# Patient Record
Sex: Female | Born: 1960 | Race: Black or African American | Hispanic: No | Marital: Married | Smoking: Never smoker
Health system: Southern US, Community
[De-identification: ages and names within clinical notes are randomized; demographics above are authoritative.]

## PROBLEM LIST (undated history)

## (undated) DIAGNOSIS — M5430 Sciatica, unspecified side: Secondary | ICD-10-CM

## (undated) DIAGNOSIS — F329 Major depressive disorder, single episode, unspecified: Secondary | ICD-10-CM

## (undated) DIAGNOSIS — I10 Essential (primary) hypertension: Secondary | ICD-10-CM

## (undated) DIAGNOSIS — F32A Depression, unspecified: Secondary | ICD-10-CM

## (undated) DIAGNOSIS — K219 Gastro-esophageal reflux disease without esophagitis: Secondary | ICD-10-CM

## (undated) HISTORY — DX: Major depressive disorder, single episode, unspecified: F32.9

## (undated) HISTORY — DX: Gastro-esophageal reflux disease without esophagitis: K21.9

## (undated) HISTORY — DX: Sciatica, unspecified side: M54.30

## (undated) HISTORY — DX: Depression, unspecified: F32.A

## (undated) HISTORY — DX: Essential (primary) hypertension: I10

---

## 2008-10-17 ENCOUNTER — Encounter: Admission: RE | Admit: 2008-10-17 | Discharge: 2008-10-17 | Payer: Self-pay | Admitting: General Practice

## 2010-03-26 ENCOUNTER — Emergency Department (HOSPITAL_COMMUNITY)
Admission: EM | Admit: 2010-03-26 | Discharge: 2010-03-26 | Payer: Self-pay | Source: Home / Self Care | Admitting: Emergency Medicine

## 2010-03-26 ENCOUNTER — Emergency Department (HOSPITAL_COMMUNITY)
Admission: EM | Admit: 2010-03-26 | Discharge: 2010-03-26 | Disposition: A | Discharge status: Other Acute Inpatient Hospital | Payer: Self-pay | Source: Home / Self Care | Admitting: Emergency Medicine

## 2010-07-29 LAB — POCT I-STAT, CHEM 8
BUN: 3 mg/dL — ABNORMAL LOW (ref 6–23)
Chloride: 108 mEq/L (ref 96–112)
Creatinine, Ser: 0.7 mg/dL (ref 0.4–1.2)
Potassium: 3.8 mEq/L (ref 3.5–5.1)
Sodium: 141 mEq/L (ref 135–145)

## 2010-07-29 LAB — POCT PREGNANCY, URINE: Preg Test, Ur: NEGATIVE

## 2013-12-15 ENCOUNTER — Other Ambulatory Visit: Payer: Self-pay | Admitting: Infectious Disease

## 2013-12-15 ENCOUNTER — Ambulatory Visit
Admission: RE | Admit: 2013-12-15 | Discharge: 2013-12-15 | Disposition: A | Discharge status: Home / Self Care | Payer: No Typology Code available for payment source | Source: Ambulatory Visit | Attending: Infectious Disease | Admitting: Infectious Disease

## 2013-12-15 DIAGNOSIS — R7611 Nonspecific reaction to tuberculin skin test without active tuberculosis: Secondary | ICD-10-CM

## 2014-10-11 ENCOUNTER — Ambulatory Visit (INDEPENDENT_AMBULATORY_CARE_PROVIDER_SITE_OTHER): Payer: Managed Care, Other (non HMO) | Admitting: Medical

## 2014-10-11 ENCOUNTER — Ambulatory Visit (HOSPITAL_BASED_OUTPATIENT_CLINIC_OR_DEPARTMENT_OTHER)
Admission: RE | Admit: 2014-10-11 | Discharge: 2014-10-11 | Disposition: A | Discharge status: Home / Self Care | Payer: Managed Care, Other (non HMO) | Source: Ambulatory Visit | Attending: Medical | Admitting: Medical

## 2014-10-11 ENCOUNTER — Other Ambulatory Visit: Payer: Self-pay | Admitting: Medical

## 2014-10-11 ENCOUNTER — Encounter: Payer: Self-pay | Admitting: Medical

## 2014-10-11 VITALS — BP 148/98 | HR 78 | Temp 98.3°F | Resp 18 | Ht 63.0 in | Wt 186.0 lb

## 2014-10-11 DIAGNOSIS — M2550 Pain in unspecified joint: Secondary | ICD-10-CM

## 2014-10-11 DIAGNOSIS — M79641 Pain in right hand: Secondary | ICD-10-CM

## 2014-10-11 DIAGNOSIS — I10 Essential (primary) hypertension: Secondary | ICD-10-CM

## 2014-10-11 DIAGNOSIS — G4700 Insomnia, unspecified: Secondary | ICD-10-CM | POA: Diagnosis not present

## 2014-10-11 DIAGNOSIS — K219 Gastro-esophageal reflux disease without esophagitis: Secondary | ICD-10-CM

## 2014-10-11 LAB — COMPREHENSIVE METABOLIC PANEL
ALBUMIN: 4.1 g/dL (ref 3.5–5.2)
ALK PHOS: 82 U/L (ref 39–117)
ALT: 22 U/L (ref 0–35)
AST: 23 U/L (ref 0–37)
BUN: 10 mg/dL (ref 6–23)
CALCIUM: 9.7 mg/dL (ref 8.4–10.5)
CHLORIDE: 104 meq/L (ref 96–112)
CO2: 27 meq/L (ref 19–32)
Creatinine, Ser: 0.65 mg/dL (ref 0.40–1.20)
GFR: 123.22 mL/min (ref >60.00)
Glucose, Bld: 114 mg/dL — ABNORMAL HIGH (ref 70–99)
Potassium: 3.4 mEq/L — ABNORMAL LOW (ref 3.5–5.1)
Sodium: 137 mEq/L (ref 135–145)
TOTAL PROTEIN: 7.9 g/dL (ref 6.0–8.3)
Total Bilirubin: 0.5 mg/dL (ref 0.2–1.2)

## 2014-10-11 LAB — CBC WITH DIFFERENTIAL/PLATELET
Basophils Absolute: 0 10*3/uL (ref 0.0–0.1)
Basophils Relative: 0.6 % (ref 0.0–3.0)
EOS ABS: 0.2 10*3/uL (ref 0.0–0.7)
Eosinophils Relative: 4.4 % (ref 0.0–5.0)
HCT: 45 % (ref 36.0–46.0)
HEMOGLOBIN: 14.5 g/dL (ref 12.0–15.0)
LYMPHS PCT: 47.4 % — AB (ref 12.0–46.0)
Lymphs Abs: 2.1 10*3/uL (ref 0.7–4.0)
MCHC: 32.3 g/dL (ref 30.0–36.0)
MCV: 82.1 fl (ref 78.0–100.0)
MONOS PCT: 6.5 % (ref 3.0–12.0)
Monocytes Absolute: 0.3 10*3/uL (ref 0.1–1.0)
NEUTROS ABS: 1.9 10*3/uL (ref 1.4–7.7)
Neutrophils Relative %: 41.1 % — ABNORMAL LOW (ref 43.0–77.0)
PLATELETS: 272 10*3/uL (ref 150.0–400.0)
RBC: 5.48 Mil/uL — AB (ref 3.87–5.11)
RDW: 17 % — AB (ref 11.5–15.5)
WBC: 4.5 10*3/uL (ref 4.0–10.5)

## 2014-10-11 LAB — RHEUMATOID FACTOR: RHEUMATOID FACTOR: 15 [IU]/mL — AB (ref <=14)

## 2014-10-11 LAB — SEDIMENTATION RATE: SED RATE: 15 mm/h (ref 0–22)

## 2014-10-11 LAB — C-REACTIVE PROTEIN: CRP: 0.4 mg/dL — AB (ref 0.5–20.0)

## 2014-10-11 MED ORDER — AMLODIPINE BESYLATE 5 MG PO TABS: 5.0000 mg | ORAL_TABLET | Freq: Every day | ORAL | Status: DC

## 2014-10-11 MED ORDER — LISINOPRIL 20 MG PO TABS: 20.0000 mg | ORAL_TABLET | Freq: Every day | ORAL | Status: AC

## 2014-10-11 MED ORDER — TRAZODONE HCL 50 MG PO TABS: 50.0000 mg | ORAL_TABLET | Freq: Every day | ORAL | Status: AC

## 2014-10-11 MED ORDER — RANITIDINE HCL 150 MG PO CAPS: 150.0000 mg | ORAL_CAPSULE | Freq: Two times a day (BID) | ORAL | Status: AC

## 2014-10-11 NOTE — Progress Notes (Signed)
Subjective:    Patient ID: Natasha Williamson, female    DOB: 01-24-1961, 54 y.o.   MRN: [id number]  HPI   I have reviewed pt PMH, PSH, FH, Social History and Surgical History  Depression- Pt used to be on sertraline. In past that this disturbed sleep. She not sure if she is depressed.  Gerd- hx of and used to be on meds. She has not been on medication for this for one year. Pt used to be on zantac.  Htn- pt used to be on lisinopril and amlodipine. Lisinopril was 20 mg a day. Amlodipine was 5 mg q day.  Sciatica- hx of occasionally in the past.  Pt reports diffuse body aches all over. Describes over joints. Shoulder, hips, upper back neck. Hands hurts. Joints in hands hurt. Occasional diffuse chest soreness with body aches but not now. Does not describe cardiac type symptoms.  Pt [employer], rare exercise, No caffeine, marrried- 3 children.     Review of Systems  Constitutional: Negative for fever, chills, diaphoresis, activity change and fatigue.  Respiratory: Negative for cough, chest tightness and shortness of breath.   Cardiovascular: Negative for chest pain, palpitations and leg swelling.  Gastrointestinal: Negative for nausea, vomiting and abdominal pain.  Musculoskeletal: Positive for arthralgias. Negative for neck pain and neck stiffness.  Neurological: Positive for headaches. Negative for dizziness, tremors, seizures, syncope, facial asymmetry, speech difficulty, weakness, light-headedness and numbness.       Very faint minimal daily ha. No associated other neuro type signs or symptoms.  Psychiatric/Behavioral: Positive for sleep disturbance. Negative for behavioral problems, confusion, dysphoric mood and agitation. The patient is not nervous/anxious.      Past Medical History  Diagnosis Date  . Hypertension   . GERD (gastroesophageal reflux disease)   . Depression   . Sciatica     History   Social History  . Marital Status: Married    Spouse Name: N/A  . Number of  Children: N/A  . Years of Education: N/A   Occupational History  . Not on file.   Social History Main Topics  . Smoking status: Never Smoker   . Smokeless tobacco: Not on file  . Alcohol Use: Not on file  . Drug Use: Not on file  . Sexual Activity: Not on file   Other Topics Concern  . Not on file   Social History Narrative  . No narrative on file    No past surgical history on file.  Family History  Problem Relation Age of Onset  . Parkinson's disease Mother   . Diabetes Father     No Known Allergies  No current outpatient prescriptions on file prior to visit.   No current facility-administered medications on file prior to visit.    BP 148/98 mmHg  Pulse 78  Temp(Src) 98.3 F (36.8 C) (Oral)  Resp 18  Ht 5\' 3"  (1.6 m)  Wt 186 lb (84.369 kg)  BMI 32.96 kg/m2  SpO2 99%  LMP 07/13/2014 (Approximate)        Objective:   Physical Exam  General Mental Status- Alert. General Appearance- Not in acute distress.   Skin General: Color- Normal Color. Moisture- Normal Moisture.  Neck Carotid Arteries- Normal color. Moisture- Normal Moisture. No carotid bruits. No JVD.  Chest and Lung Exam Auscultation: Breath Sounds:-Normal. CTA.  Cardiovascular Auscultation:Rythm- Regular, rate and rhythm. Murmurs & Other Heart Sounds:Auscultation of the heart reveals- No Murmurs.  Abdomen Inspection:-Inspeection Normal. Palpation/Percussion:Note:No mass. Palpation and Percussion of the abdomen  reveal- Non Tender, Non Distended + BS, no rebound or guarding.    Neurologic Cranial Nerve exam:- CN III-XII intact(No nystagmus), symmetric smile. Drift Test:- No drift. Romberg Exam:- Negative.  Finger to Nose:- Normal/Intact Strength:- 5/5 equal and symmetric strength both upper and lower extremities.  Musculoskeletal- Shoulder- mild pain on rom Elbows- mild pain on rom. Hands- mild thickend mcp joints bilaterally. Mild pain on flexion and extension.         Assessment & Plan:

## 2014-10-11 NOTE — Assessment & Plan Note (Signed)
Most nights. Pt not sure if she is depressed. Rx trazadone. This should help with sleep and may help her mood.

## 2014-10-11 NOTE — Assessment & Plan Note (Signed)
Various joints. Will get arthritis panel. Get xray of rt hand which is area hurts most. Tylenol for pain. May advise nsaid when bp better controlled.

## 2014-10-11 NOTE — Assessment & Plan Note (Addendum)
Cbc, cmp and rx lisinopril and amlodipine. Her prior meds.   With bp elevation if any neuro or cardio signs or symptoms ED evaluation.

## 2014-10-11 NOTE — Assessment & Plan Note (Signed)
Refill zantac. Gerd diet.

## 2014-10-11 NOTE — Progress Notes (Signed)
Pre visit review using our clinic review tool, if applicable. No additional management support is needed unless otherwise documented below in the visit note. 

## 2014-10-11 NOTE — Patient Instructions (Addendum)
HTN (hypertension) Cbc, cmp and rx lisinopril and amlodipine. Her prior meds.   With bp elevation if any neuro or cardio signs or symptoms ED evaluation.   Insomnia Most nights. Pt not sure if she is depressed. Rx trazadone. This should help with sleep and may help her mood.   Arthralgia Various joints. Will get arthritis panel. Get xray of rt hand which is area hurts most. Tylenol for pain. May advise nsaid when bp better controlled.   GERD (gastroesophageal reflux disease) Refill zantac. Gerd diet.     Note mild faint ha daily may be bp . Related will follow this.   Follow up in 1 month or as needed.

## 2014-10-12 ENCOUNTER — Telehealth: Payer: Self-pay | Admitting: Medical

## 2014-10-12 ENCOUNTER — Encounter: Payer: Self-pay | Admitting: Medical

## 2014-10-12 DIAGNOSIS — M2550 Pain in unspecified joint: Secondary | ICD-10-CM

## 2014-10-12 LAB — ANA: Anti Nuclear Antibody(ANA): NEGATIVE

## 2014-10-12 NOTE — Telephone Encounter (Signed)
Refer to rheumatology  

## 2015-10-28 ENCOUNTER — Other Ambulatory Visit: Payer: Self-pay | Admitting: Family Medicine

## 2015-10-28 ENCOUNTER — Ambulatory Visit
Admission: RE | Admit: 2015-10-28 | Discharge: 2015-10-28 | Disposition: A | Discharge status: Home / Self Care | Payer: Managed Care, Other (non HMO) | Source: Ambulatory Visit | Attending: Family Medicine | Admitting: Family Medicine

## 2015-10-28 DIAGNOSIS — R0789 Other chest pain: Secondary | ICD-10-CM

## 2017-09-07 IMAGING — CR DG CHEST 2V
2 series · 2 of 2 positions shown · non-contrast
Comparison: None.

CLINICAL DATA: Anterior bilateral rib pain for 1 year without
history of trauma.

EXAM:
CHEST  2 VIEW

[w chest pa]
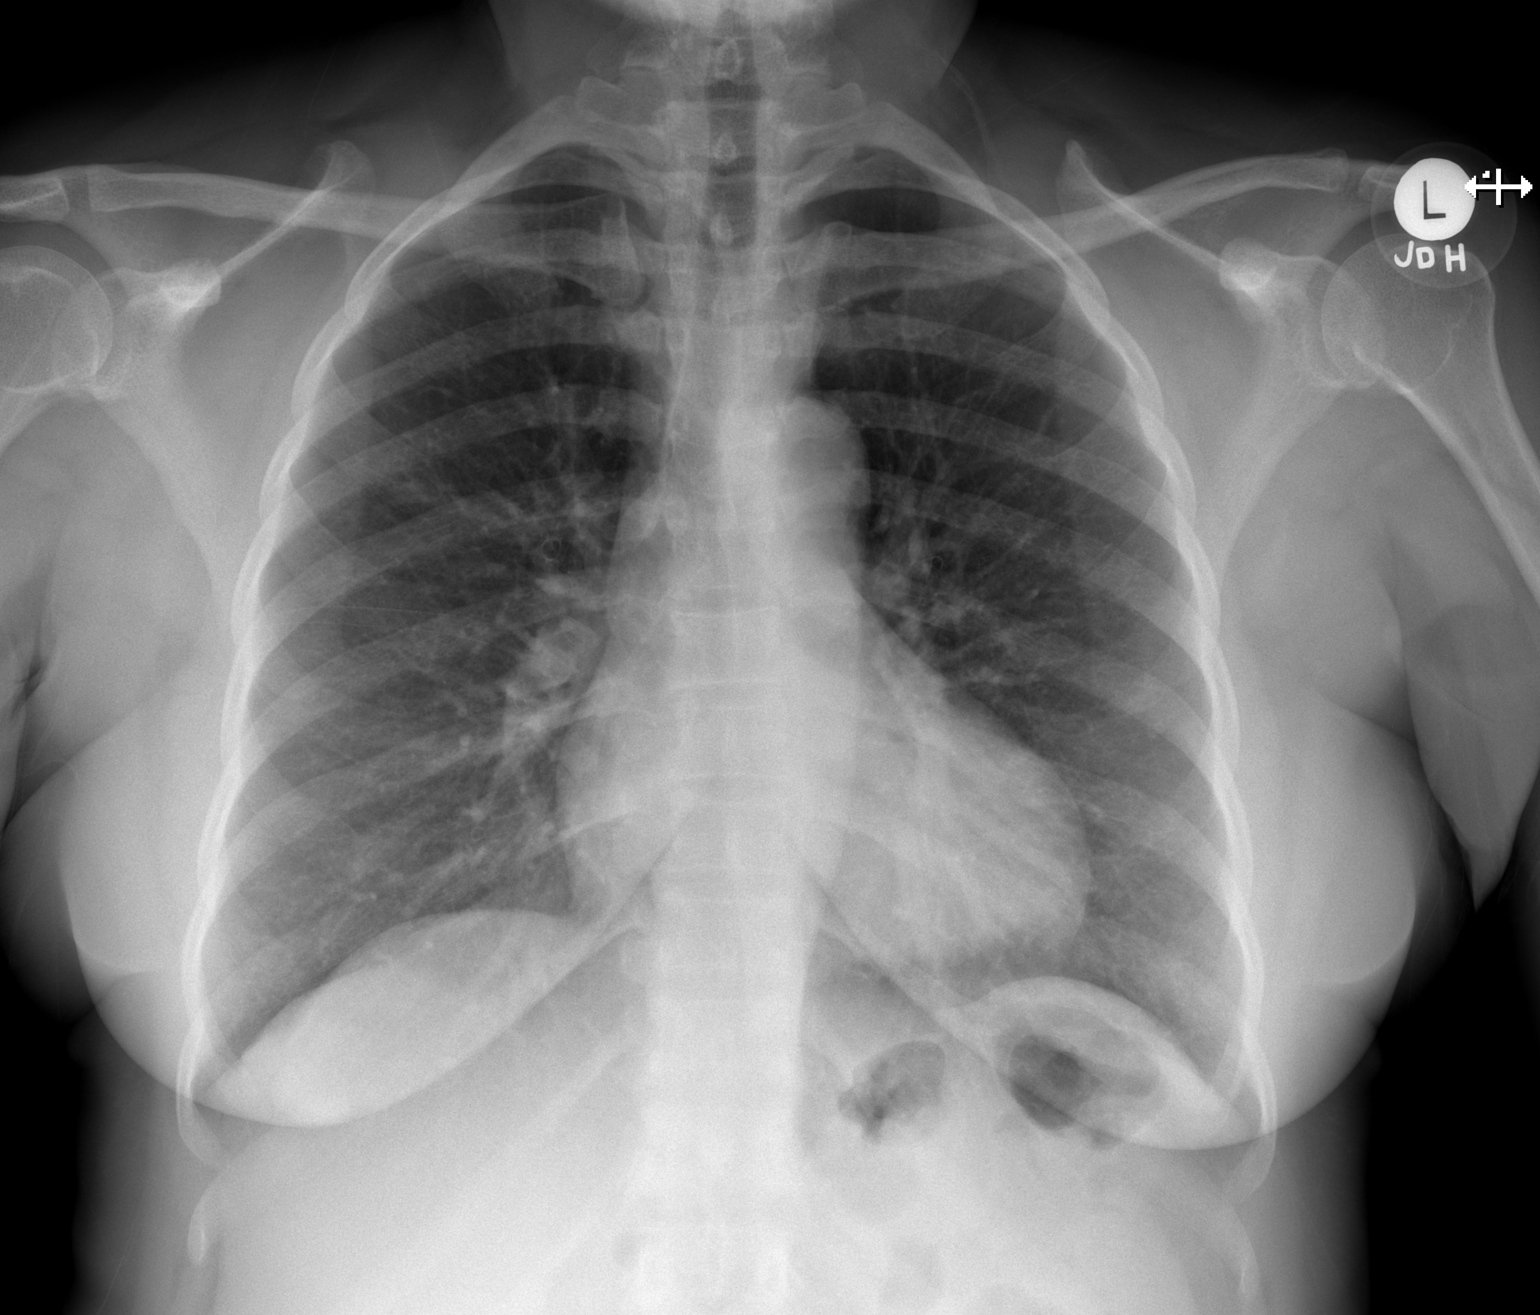

[w chest lat]
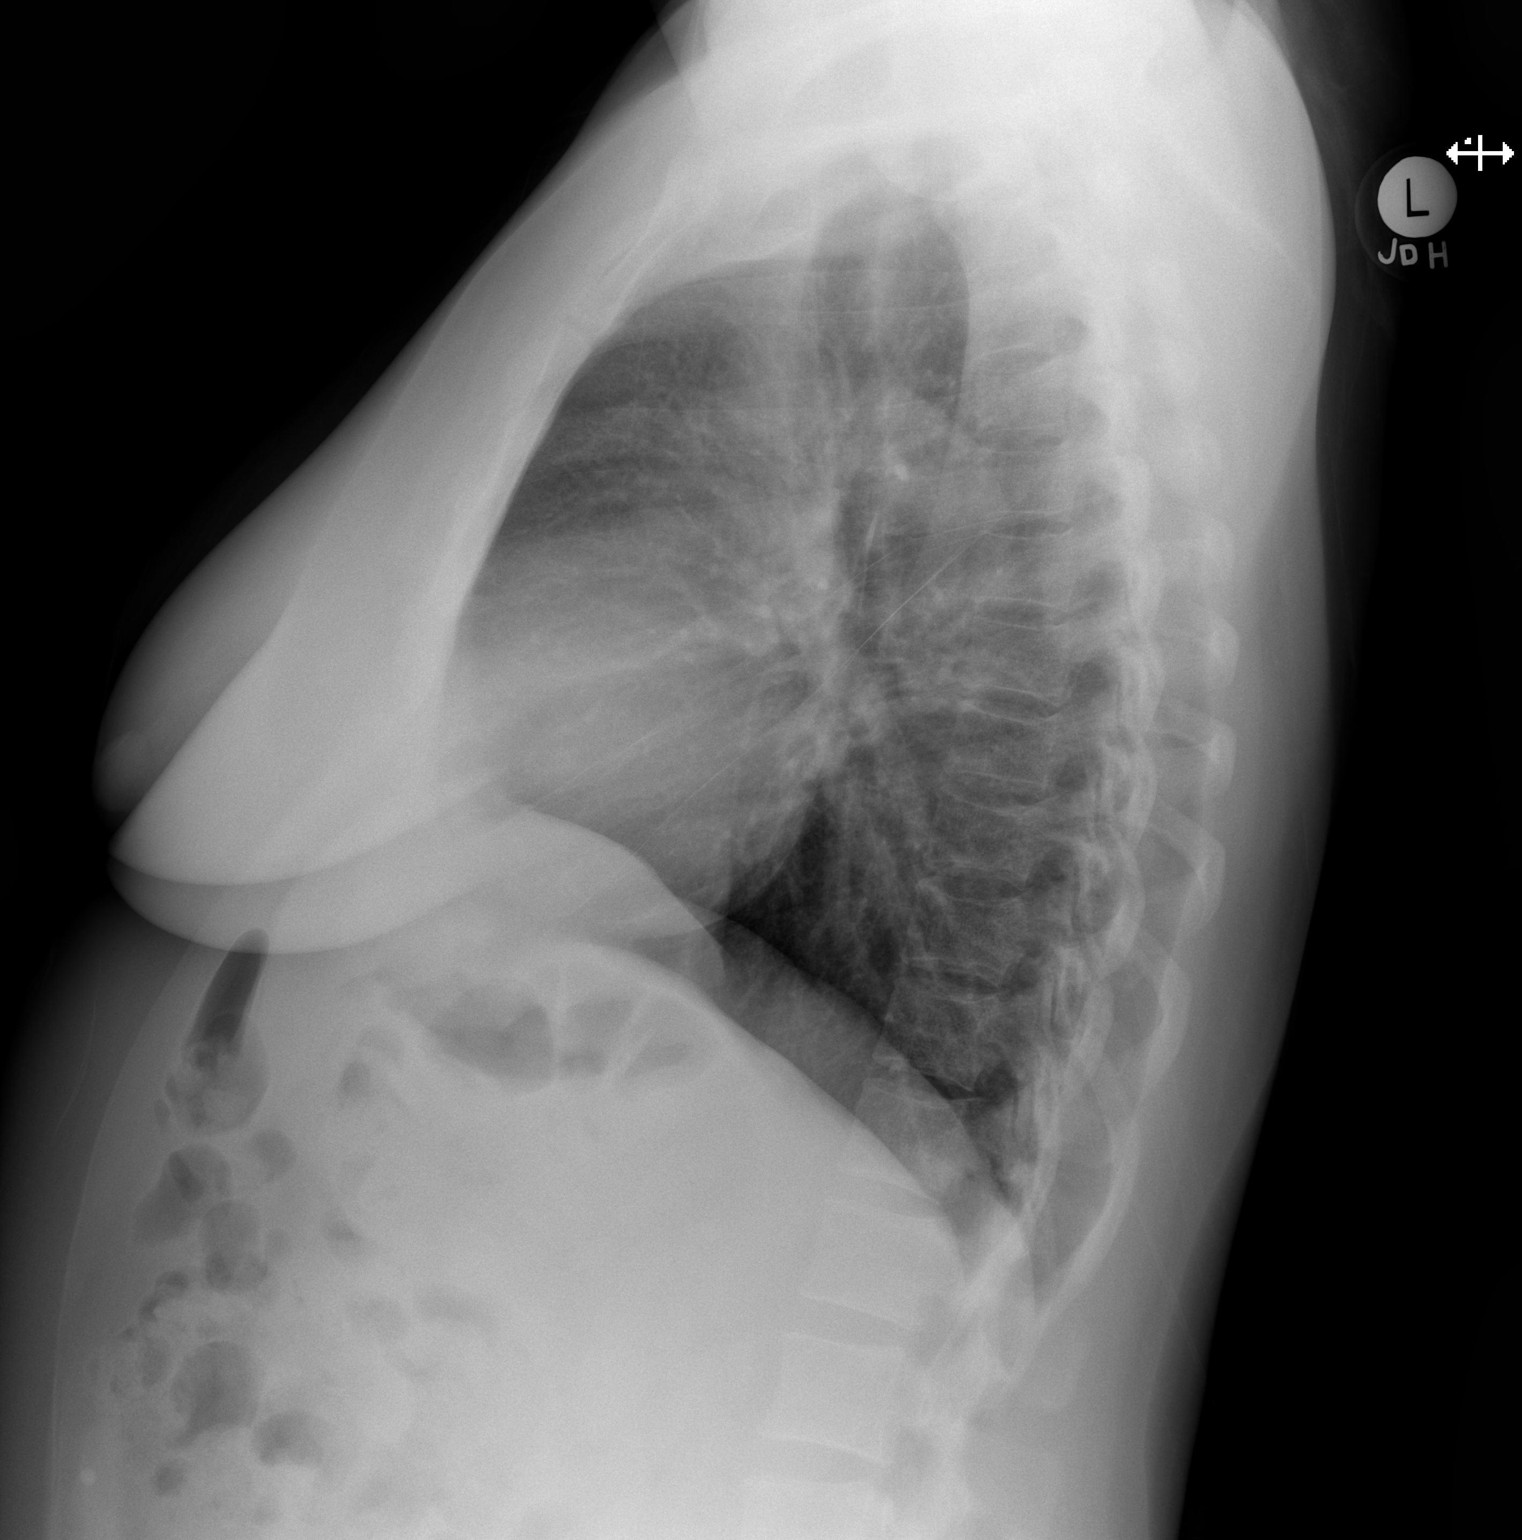

[2 of 2 positions shown; findings below may reference images not displayed]

FINDINGS: The lungs are clear wiithout focal pneumonia, edema, pneumothorax or
pleural effusion. The cardiopericardial silhouette is within normal
limits for size. The visualized bony structures of the thorax are
intact.
IMPRESSION: Stable.  No acute findings.

## 2021-02-07 ENCOUNTER — Encounter: Payer: Self-pay | Admitting: Pulmonary Disease

## 2021-02-07 ENCOUNTER — Telehealth: Payer: Self-pay

## 2021-02-07 ENCOUNTER — Ambulatory Visit (INDEPENDENT_AMBULATORY_CARE_PROVIDER_SITE_OTHER): Payer: BC Managed Care – PPO | Admitting: Pulmonary Disease

## 2021-02-07 ENCOUNTER — Other Ambulatory Visit: Payer: Self-pay

## 2021-02-07 VITALS — BP 140/86 | HR 59 | Temp 98.6°F | Ht 62.0 in | Wt 195.0 lb

## 2021-02-07 DIAGNOSIS — R0683 Snoring: Secondary | ICD-10-CM | POA: Diagnosis not present

## 2021-02-07 NOTE — Patient Instructions (Signed)
Schedule you for an in lab polysomnogram  Schedule you for breathing study  We will get a copy of your chest x-ray from your primary doctor's office to review  Tentative follow-up in about 3 months  Try as best as he can to get more hours of sleep, about 6 hours of sleep is recommended for every adult   Sleep Apnea Sleep apnea affects breathing during sleep. It causes breathing to stop for 10 seconds or more, or to become shallow. People with sleep apnea usually snore loudly. It can also increase the risk of: Heart attack. Stroke. Being very overweight (obese). Diabetes. Heart failure. Irregular heartbeat. High blood pressure. The goal of treatment is to help you breathe normally again. What are the causes? The most common cause of this condition is a collapsed or blocked airway. There are three kinds of sleep apnea: Obstructive sleep apnea. This is caused by a blocked or collapsed airway. Central sleep apnea. This happens when the brain does not send the right signals to the muscles that control breathing. Mixed sleep apnea. This is a combination of obstructive and central sleep apnea. What increases the risk? Being overweight. Smoking. Having a small airway. Being older. Being female. Drinking alcohol. Taking medicines to calm yourself (sedatives or tranquilizers). Having family members with the condition. Having a tongue or tonsils that are larger than normal. What are the signs or symptoms? Trouble staying asleep. Loud snoring. Headaches in the morning. Waking up gasping. Dry mouth or sore throat in the morning. Being sleepy or tired during the day. If you are sleepy or tired during the day, you may also: Not be able to focus your mind (concentrate). Forget things. Get angry a lot and have mood swings. Feel sad (depressed). Have changes in your personality. Have less interest in sex, if you are female. Be unable to have an erection, if you are female. How is this  treated?  Sleeping on your side. Using a medicine to get rid of mucus in your nose (decongestant). Avoiding the use of alcohol, medicines to help you relax, or certain pain medicines (narcotics). Losing weight, if needed. Changing your diet. Quitting smoking. Using a machine to open your airway while you sleep, such as: An oral appliance. This is a mouthpiece that shifts your lower jaw forward. A CPAP device. This device blows air through a mask when you breathe out (exhale). An EPAP device. This has valves that you put in each nostril. A BPAP device. This device blows air through a mask when you breathe in (inhale) and breathe out. Having surgery if other treatments do not work. Follow these instructions at home: Lifestyle Make changes that your doctor recommends. Eat a healthy diet. Lose weight if needed. Avoid alcohol, medicines to help you relax, and some pain medicines. Do not smoke or use any products that contain nicotine or tobacco. If you need help quitting, ask your doctor. General instructions Take over-the-counter and prescription medicines only as told by your doctor. If you were given a machine to use while you sleep, use it only as told by your doctor. If you are having surgery, make sure to tell your doctor you have sleep apnea. You may need to bring your device with you. Keep all follow-up visits. Contact a doctor if: The machine that you were given to use during sleep bothers you or does not seem to be working. You do not get better. You get worse. Get help right away if: Your chest hurts. You have  trouble breathing in enough air. You have an uncomfortable feeling in your back, arms, or stomach. You have trouble talking. One side of your body feels weak. A part of your face is hanging down. These symptoms may be an emergency. Get help right away. Call your local emergency services (911 in the [place] Do not wait to see if the symptoms will go away. Do not drive  yourself to the hospital. Summary This condition affects breathing during sleep. The most common cause is a collapsed or blocked airway. The goal of treatment is to help you breathe normally while you sleep. This information is not intended to replace advice given to you by your health care provider. Make sure you discuss any questions you have with your health care provider. Document Revised: 04/12/2020 Document Reviewed: 04/12/2020 Elsevier Patient Education  2022 [employer].

## 2021-02-07 NOTE — Progress Notes (Signed)
Natasha Williamson    [id number]    1960-10-25  Primary Care Physician:Saguier, Kateri Mc  Referring Physician: Esperanza Richters, PA-C [address] [place],  [place] [address]  Chief complaint:   Patient with complaints of not been able to take a deep breath, history of snoring, choking respirations at night  HPI:  History of snoring Choking respirations at night  She has had symptoms of inability to take in a deep breath, she feels she is not getting a deep breath She is not limited with activities of daily living Does not describe shortness of breath  She does have choking respirations at night Suffocation at night  She sleeps late sometimes about 2 AM and has to wake up at 6 AM She is not sleepy during the day She feels sleep is not restorative most days  Never smoker  Works as a Social research officer, government Medications as of 02/07/2021  Medication Sig   atenolol-chlorthalidone (TENORETIC) 50-25 MG tablet Take 1 tablet by mouth daily.   lisinopril (PRINIVIL,ZESTRIL) 20 MG tablet Take 1 tablet (20 mg total) by mouth daily. (Patient not taking: Reported on 02/07/2021)   ranitidine (ZANTAC) 150 MG capsule Take 1 capsule (150 mg total) by mouth 2 (two) times daily. (Patient not taking: Reported on 02/07/2021)   traZODone (DESYREL) 50 MG tablet Take 1 tablet (50 mg total) by mouth at bedtime. (Patient not taking: Reported on 02/07/2021)   [DISCONTINUED] amLODipine (NORVASC) 5 MG tablet Take 1 tablet (5 mg total) by mouth daily.   No facility-administered encounter medications on file as of 02/07/2021.    Allergies as of 02/07/2021   (No Known Allergies)    Past Medical History:  Diagnosis Date   Depression    GERD (gastroesophageal reflux disease)    Hypertension    Sciatica     Family History  Problem Relation Age of Onset   Parkinson's disease Mother    Diabetes Father     Social History   Socioeconomic History   Marital status: Married     Spouse name: Not on file   Number of children: Not on file   Years of education: Not on file   Highest education level: Not on file  Occupational History   Not on file  Tobacco Use   Smoking status: Never   Smokeless tobacco: Not on file  Substance and Sexual Activity   Alcohol use: Not on file   Drug use: Not on file   Sexual activity: Not on file  Other Topics Concern   Not on file  Social History Narrative   Not on file   Social Determinants of Health   Financial Resource Strain: Not on file  Food Insecurity: Not on file  Transportation Needs: Not on file  Physical Activity: Not on file  Stress: Not on file  Social Connections: Not on file  Intimate Partner Violence: Not on file    Review of Systems  Psychiatric/Behavioral:  Positive for sleep disturbance.    Vitals:   02/07/21 1134  BP: 140/86  Pulse: (!) 59  Temp: 98.6 F (37 C)  SpO2: 99%     Physical Exam Constitutional:      Appearance: She is obese.  HENT:     Head: Normocephalic.     Mouth/Throat:     Mouth: Mucous membranes are moist.     Comments: Mallampati 3, crowded oropharynx Cardiovascular:     Rate and  Rhythm: Normal rate and regular rhythm.     Heart sounds: No murmur heard. Pulmonary:     Effort: Pulmonary effort is normal. No respiratory distress.     Breath sounds: No stridor. No wheezing or rhonchi.  Musculoskeletal:     Cervical back: No rigidity or tenderness.  Neurological:     Mental Status: She is alert.  Psychiatric:        Mood and Affect: Mood normal.   Epworth Sleepiness Scale is 0  Data Reviewed: Most recent chest x-ray reported as normal Previous x-rays reviewed  Assessment:  Nonrestorative sleep  Complains about difficulty getting a good breath with no associated shortness of breath, not limited with activities of daily living  Inadequate sleep  Choking respirations at night  Pathophysiology of sleep disordered breathing discussed with the  patient Treatment options reviewed  Plan/Recommendations: As patient is not sleepy during the day and with inadequate sleep I did encourage increasing number of hours of sleep  Will benefit from an overnight polysomnogram Home sleep study is not optimal in this situation  Graded exercise as tolerated  We will schedule patient for pulmonary function test  We will get a copy of recent chest x-ray to review  Tentative follow-up in 3 to 4 months  Encouraged to call with any significant concerns   Virl Diamond MD [facility] 02/07/2021, 12:07 PM  CC: Esperanza Richters, PA-C

## 2021-02-07 NOTE — Telephone Encounter (Signed)
Contacted Natasha Williamson to see when and where her most recent chest x-ray was. Patient states it was completed at [facility], care everywhere shows the chest x-ray was completed on 05/27/2018

## 2021-03-07 DIAGNOSIS — K219 Gastro-esophageal reflux disease without esophagitis: Secondary | ICD-10-CM | POA: Diagnosis not present

## 2021-03-07 DIAGNOSIS — Z23 Encounter for immunization: Secondary | ICD-10-CM | POA: Diagnosis not present

## 2021-03-07 DIAGNOSIS — E669 Obesity, unspecified: Secondary | ICD-10-CM | POA: Diagnosis not present

## 2021-03-07 DIAGNOSIS — I10 Essential (primary) hypertension: Secondary | ICD-10-CM | POA: Diagnosis not present

## 2021-03-07 DIAGNOSIS — F5104 Psychophysiologic insomnia: Secondary | ICD-10-CM | POA: Diagnosis not present

## 2021-03-21 ENCOUNTER — Ambulatory Visit (HOSPITAL_BASED_OUTPATIENT_CLINIC_OR_DEPARTMENT_OTHER): Payer: BC Managed Care – PPO | Attending: Pulmonary Disease | Admitting: Pulmonary Disease

## 2021-05-23 ENCOUNTER — Ambulatory Visit: Payer: BC Managed Care – PPO | Admitting: Pulmonary Disease

## 2021-08-25 ENCOUNTER — Other Ambulatory Visit: Payer: Self-pay | Admitting: Physician Assistant
